# Patient Record
Sex: Male | Born: 1975 | Race: Black or African American | Hispanic: No | Marital: Single | Smoking: Former smoker
Health system: Southern US, Community
[De-identification: ages and names within clinical notes are randomized; demographics above are authoritative.]

## PROBLEM LIST (undated history)

## (undated) DIAGNOSIS — I10 Essential (primary) hypertension: Secondary | ICD-10-CM

## (undated) DIAGNOSIS — T7840XA Allergy, unspecified, initial encounter: Secondary | ICD-10-CM

## (undated) DIAGNOSIS — E785 Hyperlipidemia, unspecified: Secondary | ICD-10-CM

## (undated) DIAGNOSIS — J40 Bronchitis, not specified as acute or chronic: Secondary | ICD-10-CM

## (undated) HISTORY — DX: Bronchitis, not specified as acute or chronic: J40

## (undated) HISTORY — DX: Essential (primary) hypertension: I10

## (undated) HISTORY — DX: Allergy, unspecified, initial encounter: T78.40XA

## (undated) HISTORY — DX: Hyperlipidemia, unspecified: E78.5

---

## 2008-01-14 ENCOUNTER — Emergency Department (HOSPITAL_COMMUNITY): Admission: EM | Admit: 2008-01-14 | Discharge: 2008-01-14 | Payer: Self-pay | Admitting: Emergency Medicine

## 2009-02-09 ENCOUNTER — Emergency Department (HOSPITAL_COMMUNITY): Admission: EM | Admit: 2009-02-09 | Discharge: 2009-02-10 | Payer: Self-pay | Admitting: Emergency Medicine

## 2009-06-27 ENCOUNTER — Emergency Department (HOSPITAL_COMMUNITY): Admission: EM | Admit: 2009-06-27 | Discharge: 2009-06-27 | Payer: Self-pay | Admitting: Emergency Medicine

## 2012-09-28 ENCOUNTER — Ambulatory Visit: Payer: Self-pay | Admitting: Family Medicine

## 2012-09-28 VITALS — BP 148/92 | HR 70 | Temp 98.3°F | Resp 18 | Ht 72.0 in | Wt 310.0 lb

## 2012-09-28 DIAGNOSIS — E669 Obesity, unspecified: Secondary | ICD-10-CM

## 2012-09-28 DIAGNOSIS — K920 Hematemesis: Secondary | ICD-10-CM

## 2012-09-28 DIAGNOSIS — K921 Melena: Secondary | ICD-10-CM

## 2012-09-28 LAB — COMPREHENSIVE METABOLIC PANEL
AST: 20 U/L (ref 0–37)
Alkaline Phosphatase: 43 U/L (ref 39–117)
BUN: 12 mg/dL (ref 6–23)
Creat: 1.02 mg/dL (ref 0.50–1.35)
Total Bilirubin: 0.5 mg/dL (ref 0.3–1.2)

## 2012-09-28 LAB — POCT CBC
Lymph, poc: 1.4 (ref 0.6–3.4)
MCH, POC: 29.9 pg (ref 27–31.2)
MCHC: 32.2 g/dL (ref 31.8–35.4)
MID (cbc): 0.5 (ref 0–0.9)
MPV: 9.5 fL (ref 0–99.8)
POC MID %: 6.7 %M (ref 0–12)
Platelet Count, POC: 300 10*3/uL (ref 142–424)
RBC: 5.11 M/uL (ref 4.69–6.13)
RDW, POC: 14.3 %
WBC: 7.2 10*3/uL (ref 4.6–10.2)

## 2012-09-28 MED ORDER — BENZONATATE 100 MG PO CAPS: 100.0000 mg | ORAL_CAPSULE | Freq: Three times a day (TID) | ORAL | Status: DC | PRN

## 2012-09-28 MED ORDER — OMEPRAZOLE 20 MG PO CPDR: 40.0000 mg | DELAYED_RELEASE_CAPSULE | Freq: Every day | ORAL | Status: AC

## 2012-09-28 MED ORDER — SUCRALFATE 1 G PO TABS: 1.0000 g | ORAL_TABLET | Freq: Four times a day (QID) | ORAL | Status: AC

## 2012-09-28 NOTE — Patient Instructions (Addendum)
You need to cut down on alcohol with a goal of quitting drinking.  Please let us know if you need any help in this regard.  I will be in touch with the rest of your labs  For the time being, take a carafate tablet before meals and before bed.  Take the prilosec twice a day as well.    If you have any further bleeding please let us know.    Use the tessalon for cough.  I would try plain mucinex for your sinuses.    I will call the GI doctor on call and make sure there is nothing else we need to do for the time being.

## 2012-09-28 NOTE — Progress Notes (Addendum)
[facility] [address] [place] [address] [phone]  Date:  09/28/2012   Name:  Andre Cooper   DOB:  [birth year]   MRN:  [id number]  PCP:  No primary provider on file.    Chief Complaint: vomited blood, feels hot and Dizziness   History of Present Illness:  Andre Cooper is a 37 y.o. very pleasant male patient who presents with the following:  He "threw up blood" this morning, so he became concerned and came in for evaluation.  Vomited with blood twice.  It was dark red blood, seemed "like a lot."    He has a history of ulcer.  He had this ulcer 5 or 6 years ago, [place].  He is not quite sure what sort of treatment he had.  He stopped drinking "for a while" and felt better, the ulcer healed.  However he is now drinking again, as of the last 8 months or so.   He is drinking a fifth of liquor most days.   He also felt dizzy and hot this morning. No syncope  He has not seen any blood in his stool, no black or tarry stools.  He states he had a physical of some sort about one month ago, and was told to take lab results to a doctor for evaluation. He does not have his labs with him today and is not sure what was abnormal  He also has noted mucus in his throat for about 2 days, mild cough, and wants "some good sinus pills" for sinus congestion and something for cough.    There are no active problems to display for this patient.   Past Medical History  Diagnosis Date  . Allergy   . Bronchitis     History reviewed. No pertinent past surgical history.  History  Substance Use Topics  . Smoking status: Former Games developer  . Smokeless tobacco: Not on file  . Alcohol Use: Yes    Family History  Problem Relation Age of Onset  . Cancer Maternal Grandmother   . Tuberculosis Maternal Grandfather   . Alzheimer's disease Paternal Grandmother     No Known Allergies  Medication list has been reviewed and updated.  No current outpatient  prescriptions on file prior to visit.   No current facility-administered medications on file prior to visit.    Review of Systems:  As per HPI- otherwise negative  Physical Examination: Filed Vitals:   09/28/12 1247  BP: 148/92  Pulse: 70  Temp: 98.3 F (36.8 C)  Resp: 18   Filed Vitals:   09/28/12 1247  Height: 6' (1.829 m)  Weight: 310 lb (140.615 kg)   Body mass index is 42.03 kg/(m^2). Ideal Body Weight: Weight in (lb) to have BMI = 25: 183.9  GEN: WDWN, NAD, Non-toxic, A & O x 3, obese.  Tattoos over most of his body surface HEENT: Atraumatic, Normocephalic. Neck supple. No masses, No LAD.  Bilateral TM wnl, oropharynx normal.  PEERL,EOMI.   Ears and Nose: No external deformity. CV: RRR, No M/G/R. No JVD. No thrill. No extra heart sounds. PULM: CTA B, no wheezes, crackles, rhonchi. No retractions. No resp. distress. No accessory muscle use. ABD: S, NT, ND, +BS. No rebound. No definite hepatomegaly but obese abdomen limits exam.  Small umbilical hernia EXTR: No c/c/e NEURO Normal gait.  PSYCH: Normally interactive. Conversant. Not depressed or anxious appearing.  Calm demeanor.  Rectal: no gross blood  Results for orders  placed in visit on 09/28/12  POCT CBC      Result Value Range   WBC 7.2  4.6 - 10.2 K/uL   Lymph, poc 1.4  0.6 - 3.4   POC LYMPH PERCENT 19.3  10 - 50 %L   MID (cbc) 0.5  0 - 0.9   POC MID % 6.7  0 - 12 %M   POC Granulocyte 5.3  2 - 6.9   Granulocyte percent 74.0  37 - 80 %G   RBC 5.11  4.69 - 6.13 M/uL   Hemoglobin 15.3  14.1 - 18.1 g/dL   HCT, POC 04.5  40.9 - 53.7 %   MCV 93.0  80 - 97 fL   MCH, POC 29.9  27 - 31.2 pg   MCHC 32.2  31.8 - 35.4 g/dL   RDW, POC 81.1     Platelet Count, POC 300  142 - 424 K/uL   MPV 9.5  0 - 99.8 fL  IFOBT (OCCULT BLOOD)      Result Value Range   IFOBT Negative      Assessment and Plan: Hematemesis - Plan: POCT CBC, IFOBT POC (occult bld, rslt in office), Comprehensive metabolic panel, sucralfate  (CARAFATE) 1 G tablet, benzonatate (TESSALON) 100 MG capsule, omeprazole (PRILOSEC) 20 MG capsule, Protime-INR  Terrence is here today with mild upper GI bleed, question recurrent ulcer vs mallory- weiss tear.  For the time being will have him take caraftate and prilosec.  Encouraged him to d/c alcohol; he may need to taper over the course of a week or so if he has withdrawyl sx, vs doing a detox program.  He will let us kow if he needs any help with this process.  Will refer him to GI to have an upper endoscopy.    Signed Abbe Amsterdam, MD  Results for orders placed in visit on 09/28/12  COMPREHENSIVE METABOLIC PANEL      Result Value Range   Sodium 139  135 - 145 mEq/L   Potassium 4.6  3.5 - 5.3 mEq/L   Chloride 105  96 - 112 mEq/L   CO2 28  19 - 32 mEq/L   Glucose, Bld 88  70 - 99 mg/dL   BUN 12  6 - 23 mg/dL   Creat 9.14  7.82 - 9.56 mg/dL   Total Bilirubin 0.5  0.3 - 1.2 mg/dL   Alkaline Phosphatase 43  39 - 117 U/L   AST 20  0 - 37 U/L   ALT 25  0 - 53 U/L   Total Protein 7.2  6.0 - 8.3 g/dL   Albumin 4.4  3.5 - 5.2 g/dL   Calcium 9.0  8.4 - 21.3 mg/dL  PROTIME-INR      Result Value Range   Prothrombin Time 13.1  11.6 - 15.2 seconds   INR 0.99  <1.50  POCT CBC      Result Value Range   WBC 7.2  4.6 - 10.2 K/uL   Lymph, poc 1.4  0.6 - 3.4   POC LYMPH PERCENT 19.3  10 - 50 %L   MID (cbc) 0.5  0 - 0.9   POC MID % 6.7  0 - 12 %M   POC Granulocyte 5.3  2 - 6.9   Granulocyte percent 74.0  37 - 80 %G   RBC 5.11  4.69 - 6.13 M/uL   Hemoglobin 15.3  14.1 - 18.1 g/dL   HCT, POC 08.6  57.8 - 53.7 %   MCV  93.0  80 - 97 fL   MCH, POC 29.9  27 - 31.2 pg   MCHC 32.2  31.8 - 35.4 g/dL   RDW, POC 16.1     Platelet Count, POC 300  142 - 424 K/uL   MPV 9.5  0 - 99.8 fL  IFOBT (OCCULT BLOOD)      Result Value Range   IFOBT Negative     INR, LFTs ok.  Will make referral to GI

## 2012-09-29 NOTE — Addendum Note (Signed)
Addended by: Abbe Amsterdam C on: 09/29/2012 10:24 AM   Modules accepted: Orders

## 2012-11-19 ENCOUNTER — Encounter: Payer: Self-pay | Admitting: Family Medicine

## 2013-01-06 ENCOUNTER — Ambulatory Visit: Payer: Self-pay | Admitting: Family Medicine

## 2013-01-06 VITALS — BP 152/85 | HR 84 | Temp 97.8°F | Resp 18 | Wt 311.0 lb

## 2013-01-06 DIAGNOSIS — J45901 Unspecified asthma with (acute) exacerbation: Secondary | ICD-10-CM

## 2013-01-06 MED ORDER — ALBUTEROL SULFATE HFA 108 (90 BASE) MCG/ACT IN AERS: 2.0000 | INHALATION_SPRAY | Freq: Four times a day (QID) | RESPIRATORY_TRACT | Status: AC | PRN

## 2013-01-06 MED ORDER — ALBUTEROL SULFATE (2.5 MG/3ML) 0.083% IN NEBU: 2.5000 mg | INHALATION_SOLUTION | Freq: Once | RESPIRATORY_TRACT | Status: AC

## 2013-01-06 MED ORDER — BENZONATATE 100 MG PO CAPS: 100.0000 mg | ORAL_CAPSULE | Freq: Three times a day (TID) | ORAL | Status: AC | PRN

## 2013-01-06 NOTE — Progress Notes (Signed)
[facility] [address] [place] [address] [phone]  Date:  01/06/2013   Name:  Andre Cooper   DOB:  1975-07-18   MRN:  [id number]  PCP:  No primary provider on file.    Chief Complaint: Shortness of Breath, Cough and Asthma   History of Present Illness:  Andre Cooper is a 37 y.o. very pleasant male patient who presents with the following:  He states that a few years ago he was told that he had "bronchitis and asthma."  He notes that certain fumes will make him worse- like when he was exposed to kerosene heat.  A few years ago he was given an inhaler to use and told to stop smoking.  He notes that his sx are worse during the winter. He has been wheezing for 2 or 3 years.  He has had a cough for the last week or so.   His MIL has given him some of her cough syrup which did seem to help.  Apparently this was tussionex and he would like to have some of this He has not noted a fever.   He is not coughing anything up.    He used to have his worst sx after smoking MJ cigars.  He now "only" smokes twice a day, sometimes 3 times.   He is also trying to stop drinking.  He is trying to cut down from a 1/5 liquor a day.  Uncertain how much he is actually drinking now  Patient Active Problem List   Diagnosis Date Noted  . Obesity, unspecified 09/28/2012    Past Medical History  Diagnosis Date  . Allergy   . Bronchitis     No past surgical history on file.  History  Substance Use Topics  . Smoking status: Former Games developer  . Smokeless tobacco: Not on file  . Alcohol Use: Yes    Family History  Problem Relation Age of Onset  . Cancer Maternal Grandmother   . Tuberculosis Maternal Grandfather   . Alzheimer's disease Paternal Grandmother     No Known Allergies  Medication list has been reviewed and updated.  Current Outpatient Prescriptions on File Prior to Visit  Medication Sig Dispense Refill  . benzonatate (TESSALON) 100 MG capsule Take  1 capsule (100 mg total) by mouth 3 (three) times daily as needed for cough.  40 capsule  0  . omeprazole (PRILOSEC) 20 MG capsule Take 2 capsules (40 mg total) by mouth daily.  50 capsule  2  . sucralfate (CARAFATE) 1 G tablet Take 1 tablet (1 g total) by mouth 4 (four) times daily.  40 tablet  0   No current facility-administered medications on file prior to visit.    Review of Systems:  As per HPI- otherwise negative.Marland Kitchen  Physical Examination: Filed Vitals:   01/06/13 1437  BP: 152/85  Pulse: 84  Temp: 97.8 F (36.6 C)  Resp: 18   Filed Vitals:   01/06/13 1437  Weight: 311 lb (141.069 kg)   Body mass index is 42.17 kg/(m^2). Ideal Body Weight:    GEN: WDWN, NAD, Non-toxic, A & O x 3, obese.  Tattoos over most of his body including his face HEENT: Atraumatic, Normocephalic. Neck supple. No masses, No LAD. Ears and Nose: No external deformity. CV: RRR, No M/G/R. No JVD. No thrill. No extra heart sounds. PULM: CTA B, no wheezes, crackles, rhonchi. No retractions. No resp. distress. No accessory muscle use. EXTR: No c/c/e NEURO Normal  gait.  PSYCH: Normally interactive. Conversant. Not depressed or anxious appearing.  Calm demeanor.   Albuterol neb; he felt better.  States he has no current SOB Assessment and Plan: Asthma with acute exacerbation - Plan: albuterol (PROVENTIL) (2.5 MG/3ML) 0.083% nebulizer solution 2.5 mg, albuterol (PROVENTIL HFA;VENTOLIN HFA) 108 (90 BASE) MCG/ACT inhaler, benzonatate (TESSALON) 100 MG capsule  History of asthma with increased sx for several weeks.  Explained that smoking any substance will make his asthma worse.   Also explained that I am not willing to rx tussionex while he is actively using MJ and abusing alcohol.  This combination could be dangerous to him.    Albuterol HFA to use prn.  Asked him to let me know if he does not feel better soon   Signed Abbe Amsterdam, MD

## 2013-01-06 NOTE — Patient Instructions (Signed)
Let us know if you do not feel better soon. Use the albuterol as needed- you can use 1 or 2 puffs every 6 hours as needed.

## 2016-04-20 ENCOUNTER — Encounter: Payer: Self-pay | Admitting: Podiatry

## 2016-04-20 ENCOUNTER — Ambulatory Visit (INDEPENDENT_AMBULATORY_CARE_PROVIDER_SITE_OTHER): Payer: Medicaid Other

## 2016-04-20 ENCOUNTER — Telehealth: Payer: Self-pay | Admitting: *Deleted

## 2016-04-20 ENCOUNTER — Ambulatory Visit (INDEPENDENT_AMBULATORY_CARE_PROVIDER_SITE_OTHER): Payer: Medicaid Other | Admitting: Podiatry

## 2016-04-20 DIAGNOSIS — M21619 Bunion of unspecified foot: Secondary | ICD-10-CM

## 2016-04-20 DIAGNOSIS — R52 Pain, unspecified: Secondary | ICD-10-CM

## 2016-04-20 DIAGNOSIS — B351 Tinea unguium: Secondary | ICD-10-CM | POA: Diagnosis not present

## 2016-04-20 DIAGNOSIS — M79671 Pain in right foot: Secondary | ICD-10-CM

## 2016-04-20 DIAGNOSIS — M216X9 Other acquired deformities of unspecified foot: Secondary | ICD-10-CM

## 2016-04-20 DIAGNOSIS — M79672 Pain in left foot: Secondary | ICD-10-CM | POA: Diagnosis not present

## 2016-04-20 MED ORDER — NONFORMULARY OR COMPOUNDED ITEM: 2 refills | Status: AC

## 2016-04-20 MED ORDER — MELOXICAM 15 MG PO TABS: 15.0000 mg | ORAL_TABLET | Freq: Every day | ORAL | 2 refills | Status: AC

## 2016-04-20 NOTE — Progress Notes (Signed)
   Subjective:    Patient ID: Andre Cooper, male    DOB: 1975/08/20, 41 y.o.   MRN: [id number]  HPI  41 year old male presents the office today for concerns of bilateral foot and the right side worse than left which has been ongoing for 03/28/2010 months. He states that he has pain when he walks as the day goes on the gets more pain. He typically wears a shower shoe around. He denies any recent injury or trauma. His medications swelling to his feet but denies any redness or warmth. He denies any numbness or tingling he denies any recent injury or trauma or treatment. He has a states that his right big toenails thick and discolored but denies any pain to the area. He has no other complaints at this time.   Review of Systems  All other systems reviewed and are negative.      Objective:   Physical Exam  General: AAO x3, NAD  Dermatological: Right hallux toe nails hypertrophic, dystrophic, brittle, discolored, elongated. There is no pain with internal redness from the redness or drainage. There is no conical signs of infection. There is no open lesions or pre-ulcer lesions identified at this time.  Vascular: Dorsalis Pedis artery and Posterior Tibial artery pedal pulses are 2/4 bilateral with immedate capillary fill time. Pedal hair growth present. There is no pain with calf compression, swelling, warmth, erythema.   Neruologic: Grossly intact via light touch bilateral. Vibratory intact via tuning fork bilateral. Protective threshold with Semmes Wienstein monofilament intact to all pedal sites bilateral.   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 I am this I'm unable to elicit any area of tenderness to bilateral lower extremities. He points the arch of the foot where he gets pain subjectively however he is not having pain today. There is no area pinpoint bony tenderness or pain the vibratory sensation. There is no significant  edema there is no erythema or increase in warmth. MMT 5/5. Range motion intact. Equinus is present however.  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 Mild HAV is present. Is no evidence of acute fracture identified. -I with a lot of his pain is due to muscle tightness I discussed with exercises. I also discussed change in shoe gear and to wear more supportive shoe not to wear flat flip-flop all the time. -Prescribed mobic. Discussed side effects of the medication and directed to stop if any are to occur and call the office.  -Compound cream ordered today through [employer].  -Right hallux toenail is debrided this was sent for culture. -RTC 4 weeks or sooner.. Call any questions or concerns meantime.  Ovid Curd, DPM

## 2016-04-20 NOTE — Patient Instructions (Signed)

## 2016-04-20 NOTE — Telephone Encounter (Signed)
Dr. Ardelle Anton ordered [facility] Antiinflammatory Cream. Faxed to [facility].

## 2016-04-21 ENCOUNTER — Emergency Department (HOSPITAL_COMMUNITY): Payer: Medicaid Other

## 2016-04-21 ENCOUNTER — Encounter (HOSPITAL_COMMUNITY): Payer: Self-pay | Admitting: Emergency Medicine

## 2016-04-21 ENCOUNTER — Emergency Department (HOSPITAL_COMMUNITY)
Admission: EM | Admit: 2016-04-21 | Discharge: 2016-04-21 | Disposition: A | Discharge status: Home / Self Care | Payer: Medicaid Other | Attending: Emergency Medicine | Admitting: Emergency Medicine

## 2016-04-21 DIAGNOSIS — I10 Essential (primary) hypertension: Secondary | ICD-10-CM | POA: Diagnosis not present

## 2016-04-21 DIAGNOSIS — W07XXXA Fall from chair, initial encounter: Secondary | ICD-10-CM | POA: Insufficient documentation

## 2016-04-21 DIAGNOSIS — Z79899 Other long term (current) drug therapy: Secondary | ICD-10-CM | POA: Insufficient documentation

## 2016-04-21 DIAGNOSIS — Y939 Activity, unspecified: Secondary | ICD-10-CM | POA: Insufficient documentation

## 2016-04-21 DIAGNOSIS — Y999 Unspecified external cause status: Secondary | ICD-10-CM | POA: Insufficient documentation

## 2016-04-21 DIAGNOSIS — S161XXA Strain of muscle, fascia and tendon at neck level, initial encounter: Secondary | ICD-10-CM | POA: Insufficient documentation

## 2016-04-21 DIAGNOSIS — Y92511 Restaurant or cafe as the place of occurrence of the external cause: Secondary | ICD-10-CM | POA: Diagnosis not present

## 2016-04-21 DIAGNOSIS — S0990XA Unspecified injury of head, initial encounter: Secondary | ICD-10-CM | POA: Diagnosis present

## 2016-04-21 DIAGNOSIS — Z87891 Personal history of nicotine dependence: Secondary | ICD-10-CM | POA: Diagnosis not present

## 2016-04-21 DIAGNOSIS — W19XXXA Unspecified fall, initial encounter: Secondary | ICD-10-CM

## 2016-04-21 DIAGNOSIS — T148XXA Other injury of unspecified body region, initial encounter: Secondary | ICD-10-CM

## 2016-04-21 MED ORDER — HYDROCODONE-ACETAMINOPHEN 5-325 MG PO TABS
2.0000 | ORAL_TABLET | Freq: Once | ORAL | Status: AC
  Administered 2016-04-21: 2 via ORAL
  Filled 2016-04-21: qty 2

## 2016-04-21 MED ORDER — CYCLOBENZAPRINE HCL 10 MG PO TABS: 10.0000 mg | ORAL_TABLET | Freq: Three times a day (TID) | ORAL | 0 refills | Status: AC | PRN

## 2016-04-21 MED ORDER — NAPROXEN 500 MG PO TABS: 500.0000 mg | ORAL_TABLET | Freq: Two times a day (BID) | ORAL | 0 refills | Status: AC | PRN

## 2016-04-21 NOTE — ED Notes (Signed)
Pt understood dc material. NAD noted. Scripts given at dc 

## 2016-04-21 NOTE — ED Triage Notes (Signed)
Pt reports while at a restaurant, he went to sit in a chair and the chair broke. Pt reports that he landed on left side and hit head on metal shelf behind. Pt denies + LOC reports some dizziness after incident. Pt c/o pain to left leg/knee, left wrist and back of head.

## 2016-04-21 NOTE — ED Provider Notes (Signed)
[facility]-EMERGENCY DEPT Provider Note   CSN: [id number] Arrival date & time: 04/21/16  4098     History   Chief Complaint Chief Complaint  Patient presents with  . Fall  . Leg Injury  . Knee Pain  . Wrist Pain  . Head Injury    HPI Andre Cooper is a 41 y.o. male.  HPI 41 year old male with no significant past medical history who presents with headache, neck pain, left arm pain, and left leg pain. The patient states he was in his usual state of health until this afternoon. He sat down in a chair when it reportedly broke and its legs buckled inward. He reportedly fell onto his left side then struck the back of his head. No loss of consciousness but he was dizzy. He describes an aching, throbbing lower back pain, headache, and left hip and arm pain. He was able to ambulate after the episode. He is not on blood thinners. No other medical complaints.  Past Medical History:  Diagnosis Date  . Allergy   . Bronchitis   . Hyperlipidemia   . Hypertension     Patient Active Problem List   Diagnosis Date Noted  . Obesity, unspecified 09/28/2012    History reviewed. No pertinent surgical history.     Home Medications    Prior to Admission medications   Medication Sig Start Date End Date Taking? Authorizing Provider  albuterol (PROVENTIL HFA;VENTOLIN HFA) 108 (90 BASE) MCG/ACT inhaler Inhale 2 puffs into the lungs every 6 (six) hours as needed for wheezing or shortness of breath. Patient not taking: Reported on 04/20/2016 01/06/13   Pearline Cables, MD  atorvastatin (LIPITOR) 10 MG tablet Take 10 mg by mouth at bedtime. 04/12/16   Historical Provider, MD  benzonatate (TESSALON) 100 MG capsule Take 1 capsule (100 mg total) by mouth 3 (three) times daily as needed for cough. Patient not taking: Reported on 04/20/2016 01/06/13   Pearline Cables, MD  chlorthalidone (HYGROTON) 25 MG tablet TAKE 1 TABLET (25 MG) BY ORAL ROUTE ONCE DAILY FOR 30 DAYS 04/12/16   Historical Provider, MD    cyclobenzaprine (FLEXERIL) 10 MG tablet Take 1 tablet (10 mg total) by mouth 3 (three) times daily as needed for muscle spasms. 04/21/16   Shaune Pollack, MD  meloxicam (MOBIC) 15 MG tablet Take 1 tablet (15 mg total) by mouth daily. 04/20/16 04/20/17  Vivi Barrack, DPM  naproxen (NAPROSYN) 500 MG tablet Take 1 tablet (500 mg total) by mouth 2 (two) times daily as needed for moderate pain. 04/21/16 04/28/16  Shaune Pollack, MD  NONFORMULARY OR COMPOUNDED ITEM [facility]: Antiinflammatory Cream - Diclofenac 3%, Baclofen 2%, Lidocaine 2%, apply 1-2 grams to affected area 3-4 times daily. 04/20/16   Vivi Barrack, DPM  omeprazole (PRILOSEC) 20 MG capsule Take 2 capsules (40 mg total) by mouth daily. Patient not taking: Reported on 04/20/2016 09/28/12   Gwenlyn Found Copland, MD  sucralfate (CARAFATE) 1 G tablet Take 1 tablet (1 g total) by mouth 4 (four) times daily. Patient not taking: Reported on 04/20/2016 09/28/12   Pearline Cables, MD    Family History Family History  Problem Relation Age of Onset  . Cancer Maternal Grandmother   . Tuberculosis Maternal Grandfather   . Alzheimer's disease Paternal Grandmother     Social History Social History  Substance Use Topics  . Smoking status: Former Games developer  . Smokeless tobacco: Never Used  . Alcohol use Yes     Allergies  Patient has no known allergies.   Review of Systems Review of Systems  Constitutional: Negative for chills, fatigue and fever.  HENT: Negative for congestion and rhinorrhea.   Eyes: Negative for visual disturbance.  Respiratory: Negative for cough, shortness of breath and wheezing.   Cardiovascular: Negative for chest pain and leg swelling.  Gastrointestinal: Negative for abdominal pain, diarrhea, nausea and vomiting.  Genitourinary: Negative for dysuria and flank pain.  Musculoskeletal: Positive for arthralgias, back pain, neck pain and neck stiffness.  Skin: Negative for rash and wound.  Allergic/Immunologic:  Negative for immunocompromised state.  Neurological: Positive for dizziness and headaches. Negative for syncope and weakness.  All other systems reviewed and are negative.    Physical Exam Updated Vital Signs BP (!) 118/99   Pulse 70   Temp 98.7 F (37.1 C) (Oral)   Resp 16   Ht 6' (1.829 m)   Wt (!) 310 lb (140.6 kg)   SpO2 99%   BMI 42.04 kg/m   Physical Exam  Constitutional: He is oriented to person, place, and time. He appears well-developed and well-nourished. No distress.  HENT:  Head: Normocephalic and atraumatic.  No apparent trauma to the head, neck, or face.  Eyes: Conjunctivae are normal.  Neck: Neck supple.  Mild bilateral paraspinal tenderness. No midline deformity. Full, painless range of motion.  Cardiovascular: Normal rate, regular rhythm and normal heart sounds.  Exam reveals no friction rub.   No murmur heard. Pulmonary/Chest: Effort normal and breath sounds normal. No respiratory distress. He has no wheezes. He has no rales.  Abdominal: Soft. He exhibits no distension.  Musculoskeletal: He exhibits tenderness (Moderate left paraspinal tenderness.). He exhibits no edema.  Tenderness to palpation over left elbow as well as left greater trochanter and knee. No bruising or deformity.  Neurological: He is alert and oriented to person, place, and time. No cranial nerve deficit. He exhibits normal muscle tone.  Distal strength and sensation 5 out of 5 and intact in bilateral upper and lower extremities.  Skin: Skin is warm. Capillary refill takes less than 2 seconds.  Psychiatric: He has a normal mood and affect.  Nursing note and vitals reviewed.    ED Treatments / Results  Labs (all labs ordered are listed, but only abnormal results are displayed) Labs Reviewed - No data to display  EKG  EKG Interpretation None       Radiology Dg Chest 2 View  Result Date: 04/21/2016 CLINICAL DATA:  Status post fall when chair broke, with left-sided chest pain.  Initial encounter. EXAM: CHEST  2 VIEW COMPARISON:  Chest radiograph performed 01/14/2008 FINDINGS: The lungs are well-aerated and clear. There is no evidence of focal opacification, pleural effusion or pneumothorax. The heart is normal in size; the mediastinal contour is within normal limits. No acute osseous abnormalities are seen. IMPRESSION: No acute cardiopulmonary process seen. No displaced rib fractures identified. Electronically Signed   By: Roanna Raider M.D.   On: 04/21/2016 20:46   Dg Lumbar Spine 2-3 Views  Result Date: 04/21/2016 CLINICAL DATA:  Status post fall when chair broke, with lower back pain. Initial encounter. EXAM: LUMBAR SPINE - 2-3 VIEW COMPARISON:  None. FINDINGS: There is no evidence of fracture or subluxation. Vertebral bodies demonstrate normal height and alignment. Intervertebral disc spaces are preserved. The visualized neural foramina are grossly unremarkable in appearance. The visualized bowel gas pattern is unremarkable in appearance; air and stool are noted within the colon. The sacroiliac joints are within normal limits. IMPRESSION: No  evidence of fracture or subluxation along the lumbar spine. Electronically Signed   By: Roanna Raider M.D.   On: 04/21/2016 20:53   Dg Elbow Complete Left  Result Date: 04/21/2016 CLINICAL DATA:  Status post fall onto left side, with left elbow pain. Initial encounter. EXAM: LEFT ELBOW - COMPLETE 3+ VIEW COMPARISON:  None. FINDINGS: There is no evidence of fracture or dislocation. The visualized joint spaces are preserved. No significant joint effusion is identified. The soft tissues are unremarkable in appearance. IMPRESSION: No evidence of fracture or dislocation. Electronically Signed   By: Roanna Raider M.D.   On: 04/21/2016 20:53   Dg Knee 2 Views Left  Result Date: 04/21/2016 CLINICAL DATA:  Status post fall when chair broke, with left knee pain. Initial encounter. EXAM: LEFT KNEE - 1-2 VIEW COMPARISON:  None. FINDINGS: There  is no evidence of fracture or dislocation. The joint spaces are preserved. No significant degenerative change is seen; the patellofemoral joint is grossly unremarkable in appearance. No significant joint effusion is seen. The visualized soft tissues are normal in appearance. IMPRESSION: No evidence of fracture or dislocation. Electronically Signed   By: Roanna Raider M.D.   On: 04/21/2016 20:46   Ct Head Wo Contrast  Result Date: 04/21/2016 CLINICAL DATA:  Was in a chair in a restaurant when the chair gave way with patient falling backwards, struck head on concrete floor and against a metal chest drawer, pain at back of head, neck pain, dizziness, history hypertension, former smoker EXAM: CT HEAD WITHOUT CONTRAST CT CERVICAL SPINE WITHOUT CONTRAST TECHNIQUE: Multidetector CT imaging of the head and cervical spine was performed following the standard protocol without intravenous contrast. Multiplanar CT image reconstructions of the cervical spine were also generated. COMPARISON:  None FINDINGS: CT HEAD FINDINGS Brain: Normal ventricular morphology. No midline shift or mass effect. Normal appearance of brain parenchyma. No intracranial hemorrhage, mass lesion, evidence of acute infarction, or extra-axial fluid collection. Vascular: Unremarkable Skull: Intact Sinuses/Orbits: Clear Other: N/A CT CERVICAL SPINE FINDINGS Alignment: Normal Skull base and vertebrae: Degradation of image quality at the mid to caudal cervical spine secondary to body habitus. Visualized skullbase intact. Osseous mineralization grossly normal. Vertebral body heights maintained without fracture or bone destruction. Minimal motion artifacts at T1. Soft tissues and spinal canal: Prevertebral soft tissues normal thickness. Visualize cervical soft tissues otherwise unremarkable. Disc levels:  Maintained Upper chest: Lung apices clear. Other: N/A IMPRESSION: No acute intracranial abnormalities. No acute cervical spine abnormalities as above.  Electronically Signed   By: Ulyses Southward M.D.   On: 04/21/2016 20:42   Ct Cervical Spine Wo Contrast  Result Date: 04/21/2016 CLINICAL DATA:  Was in a chair in a restaurant when the chair gave way with patient falling backwards, struck head on concrete floor and against a metal chest drawer, pain at back of head, neck pain, dizziness, history hypertension, former smoker EXAM: CT HEAD WITHOUT CONTRAST CT CERVICAL SPINE WITHOUT CONTRAST TECHNIQUE: Multidetector CT imaging of the head and cervical spine was performed following the standard protocol without intravenous contrast. Multiplanar CT image reconstructions of the cervical spine were also generated. COMPARISON:  None FINDINGS: CT HEAD FINDINGS Brain: Normal ventricular morphology. No midline shift or mass effect. Normal appearance of brain parenchyma. No intracranial hemorrhage, mass lesion, evidence of acute infarction, or extra-axial fluid collection. Vascular: Unremarkable Skull: Intact Sinuses/Orbits: Clear Other: N/A CT CERVICAL SPINE FINDINGS Alignment: Normal Skull base and vertebrae: Degradation of image quality at the mid to caudal cervical spine secondary to  body habitus. Visualized skullbase intact. Osseous mineralization grossly normal. Vertebral body heights maintained without fracture or bone destruction. Minimal motion artifacts at T1. Soft tissues and spinal canal: Prevertebral soft tissues normal thickness. Visualize cervical soft tissues otherwise unremarkable. Disc levels:  Maintained Upper chest: Lung apices clear. Other: N/A IMPRESSION: No acute intracranial abnormalities. No acute cervical spine abnormalities as above. Electronically Signed   By: Ulyses Southward M.D.   On: 04/21/2016 20:42   Dg Hip Unilat W Or Wo Pelvis 2-3 Views Left  Result Date: 04/21/2016 CLINICAL DATA:  Status post fall when chair broke, with left hip pain. Initial encounter. EXAM: DG HIP (WITH OR WITHOUT PELVIS) 2-3V LEFT COMPARISON:  None. FINDINGS: There is no  evidence of fracture or dislocation. Both femoral heads are seated normally within their respective acetabula. The proximal left femur appears intact. No significant degenerative change is appreciated. The sacroiliac joints are unremarkable in appearance. The visualized bowel gas pattern is grossly unremarkable in appearance. Scattered phleboliths are noted within the pelvis. IMPRESSION: No evidence of fracture or dislocation. Electronically Signed   By: Roanna Raider M.D.   On: 04/21/2016 20:46    Procedures Procedures (including critical care time)  Medications Ordered in ED Medications  HYDROcodone-acetaminophen (NORCO/VICODIN) 5-325 MG per tablet 2 tablet (2 tablets Oral Given 04/21/16 1952)     Initial Impression / Assessment and Plan / ED Course  I have reviewed the triage vital signs and the nursing notes.  Pertinent labs & imaging results that were available during my care of the patient were reviewed by me and considered in my medical decision making (see chart for details).     41 year old male with no significant past medical history here with diffuse pain after mechanical fall from ground level. Vital signs are stable. Imaging is negative for acute fracture or abnormality. Suspect musculoskeletal pain and contusions. No blood thinner use or high risk features. Will discharge with NSAIDs and muscle relaxants.  Final Clinical Impressions(s) / ED Diagnoses   Final diagnoses:  Fall, initial encounter  Muscle strain    New Prescriptions Discharge Medication List as of 04/21/2016  9:10 PM    START taking these medications   Details  cyclobenzaprine (FLEXERIL) 10 MG tablet Take 1 tablet (10 mg total) by mouth 3 (three) times daily as needed for muscle spasms., Starting Sat 04/21/2016, Print    naproxen (NAPROSYN) 500 MG tablet Take 1 tablet (500 mg total) by mouth 2 (two) times daily as needed for moderate pain., Starting Sat 04/21/2016, Until Sat 04/28/2016, Print           Shaune Pollack, MD 04/22/16 [phone]

## 2016-04-24 ENCOUNTER — Other Ambulatory Visit (HOSPITAL_BASED_OUTPATIENT_CLINIC_OR_DEPARTMENT_OTHER): Payer: Self-pay

## 2016-04-24 DIAGNOSIS — R0683 Snoring: Secondary | ICD-10-CM

## 2016-04-24 DIAGNOSIS — G4700 Insomnia, unspecified: Secondary | ICD-10-CM

## 2016-04-25 LAB — FUNGAL STAIN

## 2016-05-01 ENCOUNTER — Ambulatory Visit: Payer: Self-pay | Admitting: Surgery

## 2016-05-18 ENCOUNTER — Ambulatory Visit: Payer: Medicaid Other | Admitting: Podiatry

## 2016-06-12 ENCOUNTER — Ambulatory Visit (HOSPITAL_BASED_OUTPATIENT_CLINIC_OR_DEPARTMENT_OTHER): Payer: Medicaid Other | Attending: Nurse Practitioner

## 2016-06-26 ENCOUNTER — Encounter: Payer: Self-pay | Admitting: Internal Medicine

## 2018-02-20 IMAGING — DX DG ELBOW COMPLETE 3+V*L*
4 series · 4 of 4 positions shown · non-contrast
Comparison: None.

CLINICAL DATA: Status post fall onto left side, with left elbow
pain. Initial encounter.

EXAM:
LEFT ELBOW - COMPLETE 3+ VIEW

[elbow ap]
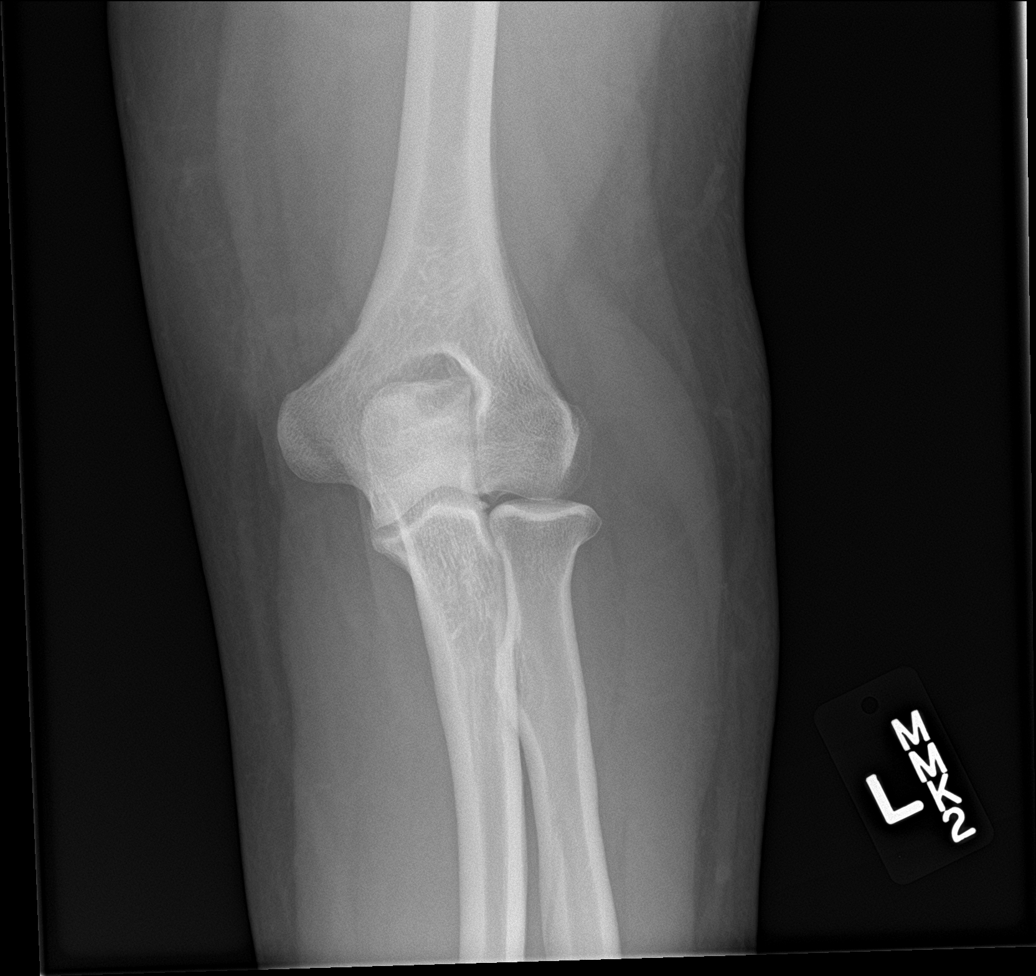

[elbow obl (1 of 2)]
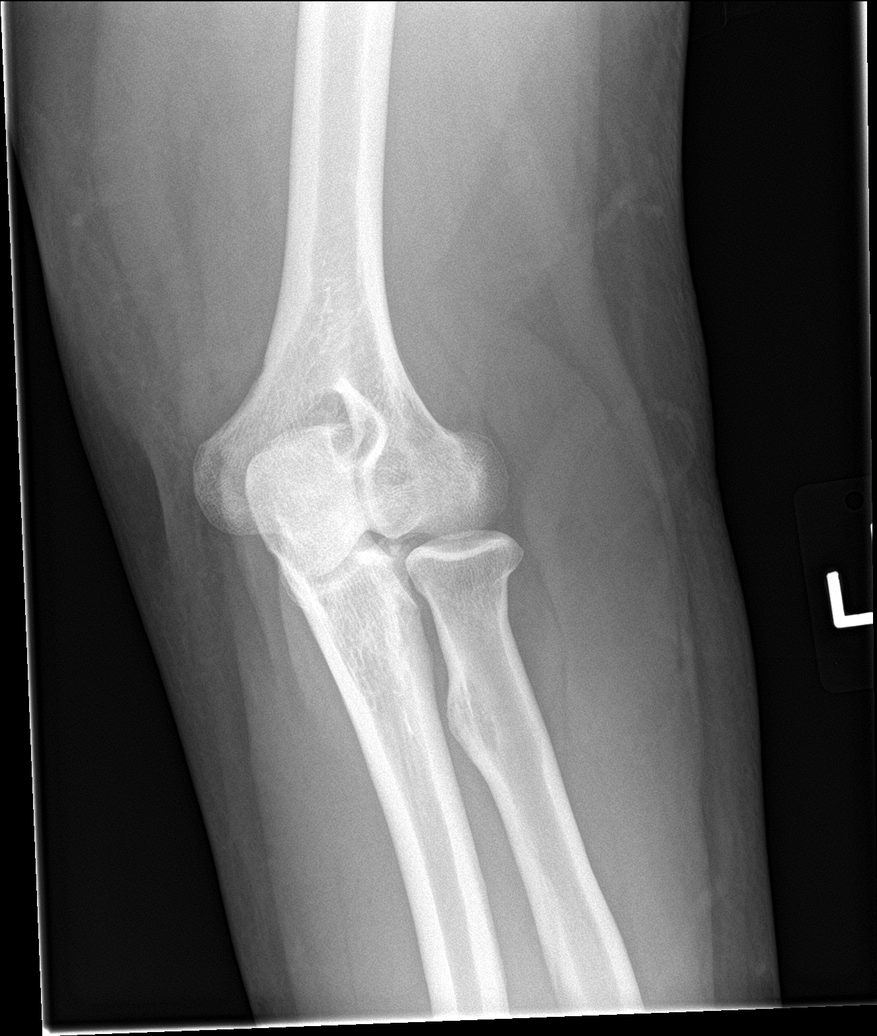

[elbow obl (2 of 2)]
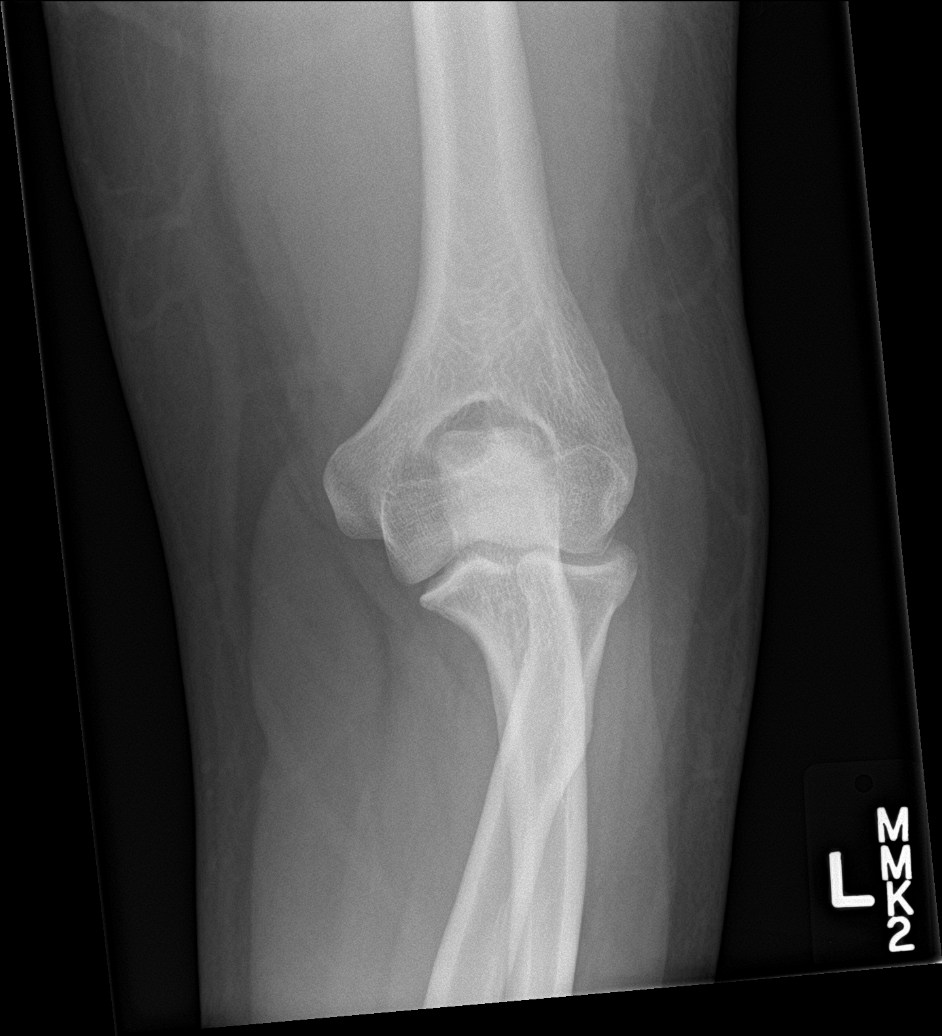

[elbow lat]
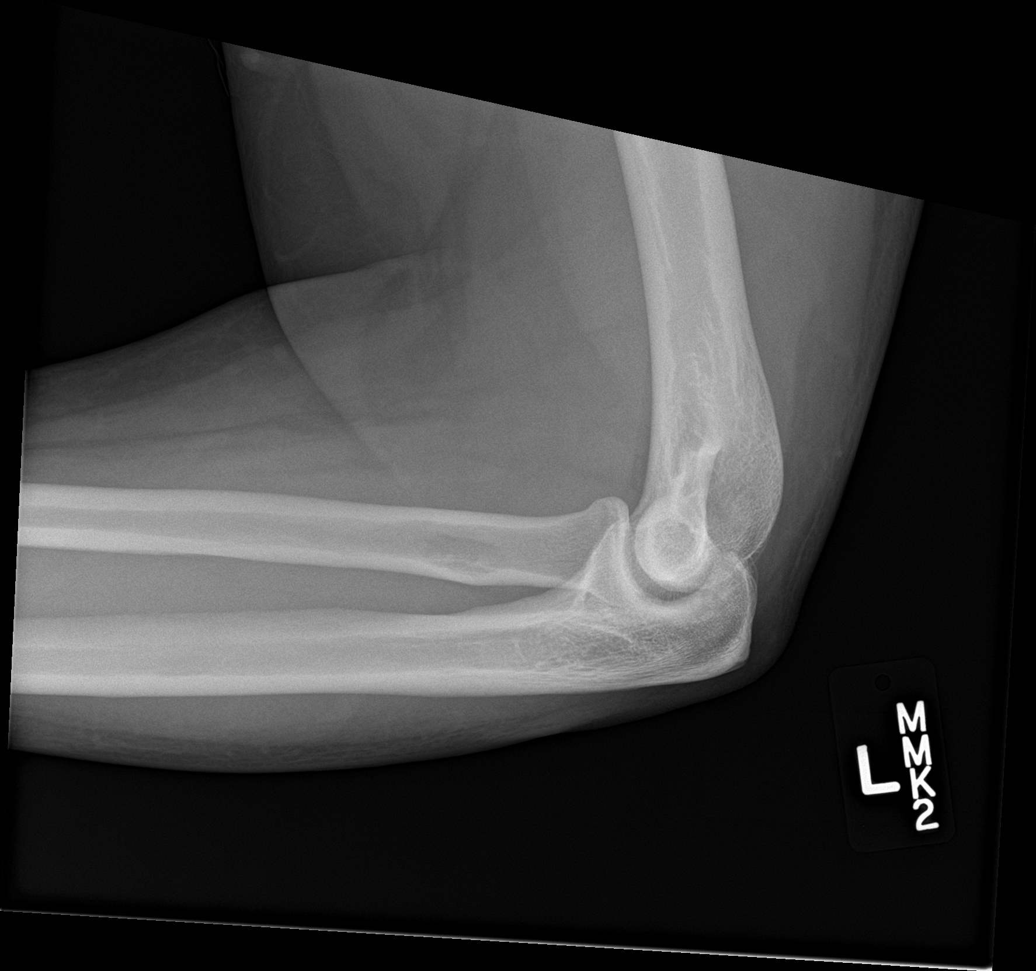

[4 of 4 positions shown; findings below may reference images not displayed]

FINDINGS: There is no evidence of fracture or dislocation. The visualized
joint spaces are preserved. No significant joint effusion is
identified. The soft tissues are unremarkable in appearance.
IMPRESSION: No evidence of fracture or dislocation.

## 2018-02-20 IMAGING — DX DG HIP (WITH OR WITHOUT PELVIS) 2-3V*L*
3 series · 3 of 3 positions shown · non-contrast
Comparison: None.

CLINICAL DATA: Status post fall when chair broke, with left hip
pain. Initial encounter.

EXAM:
DG HIP (WITH OR WITHOUT PELVIS) 2-3V LEFT

[pelvis ap]
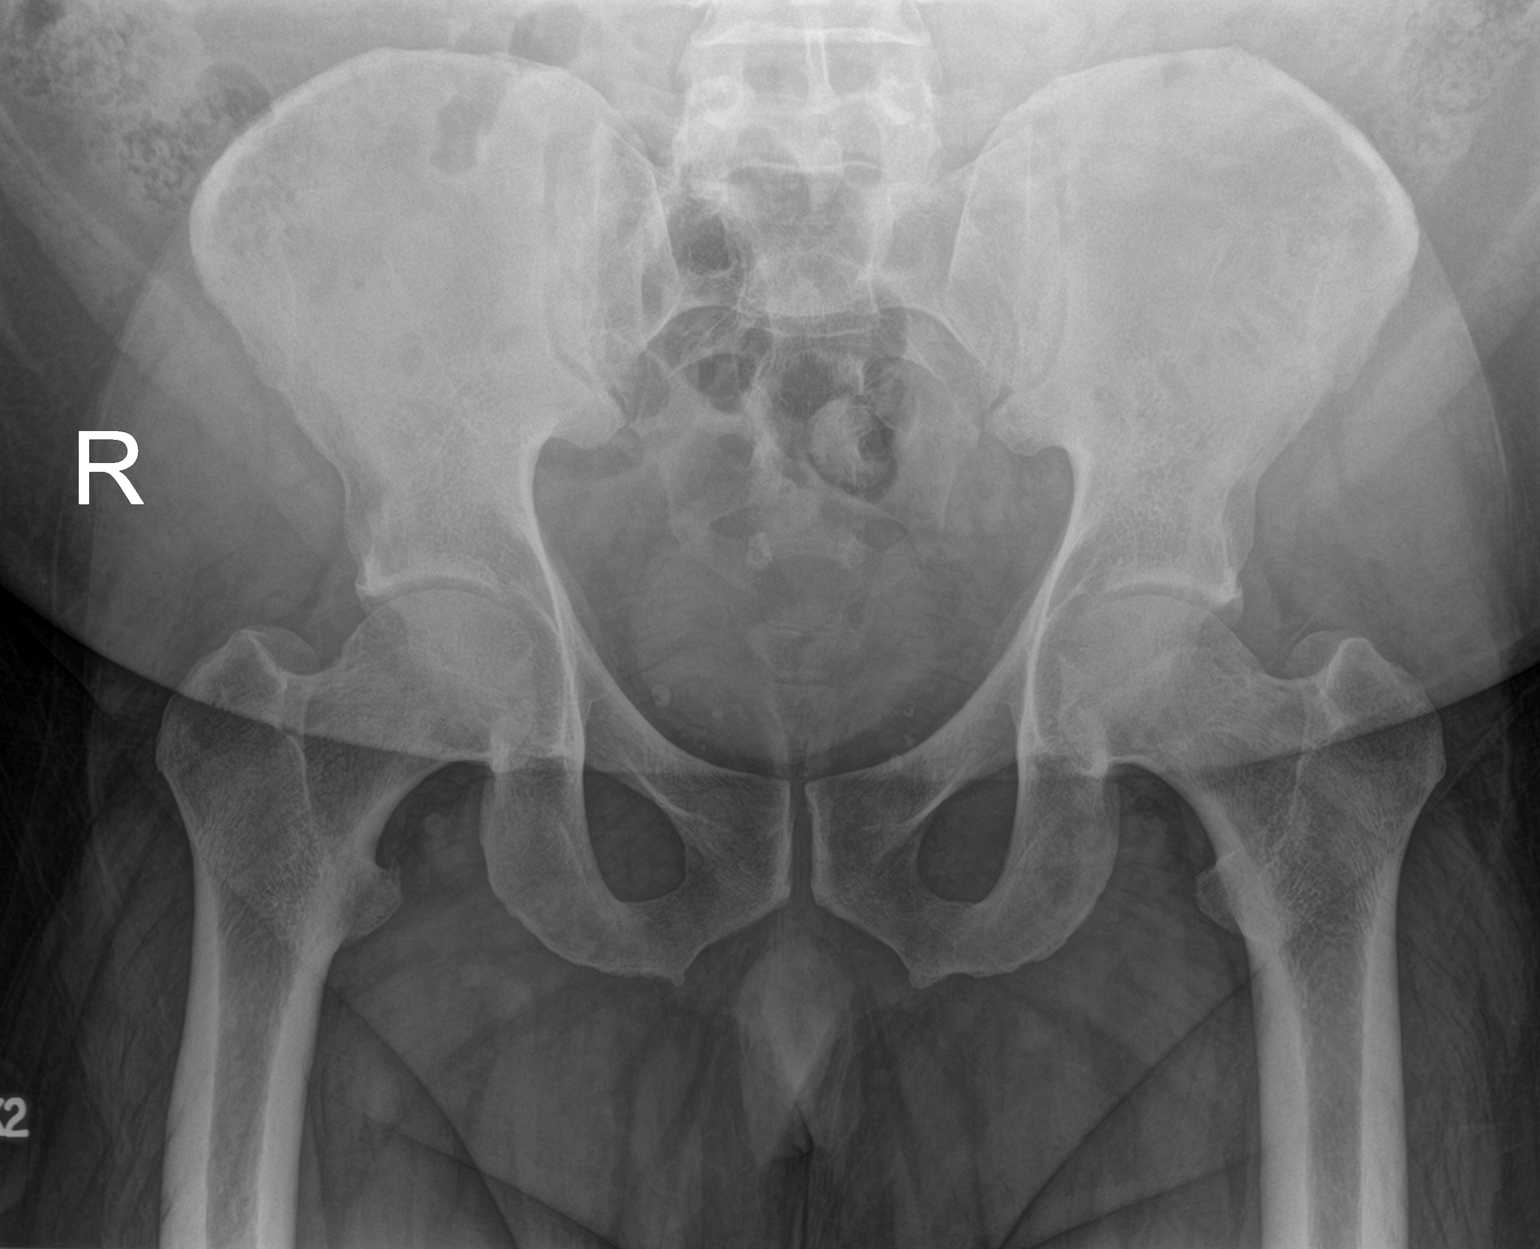

[hip ap]
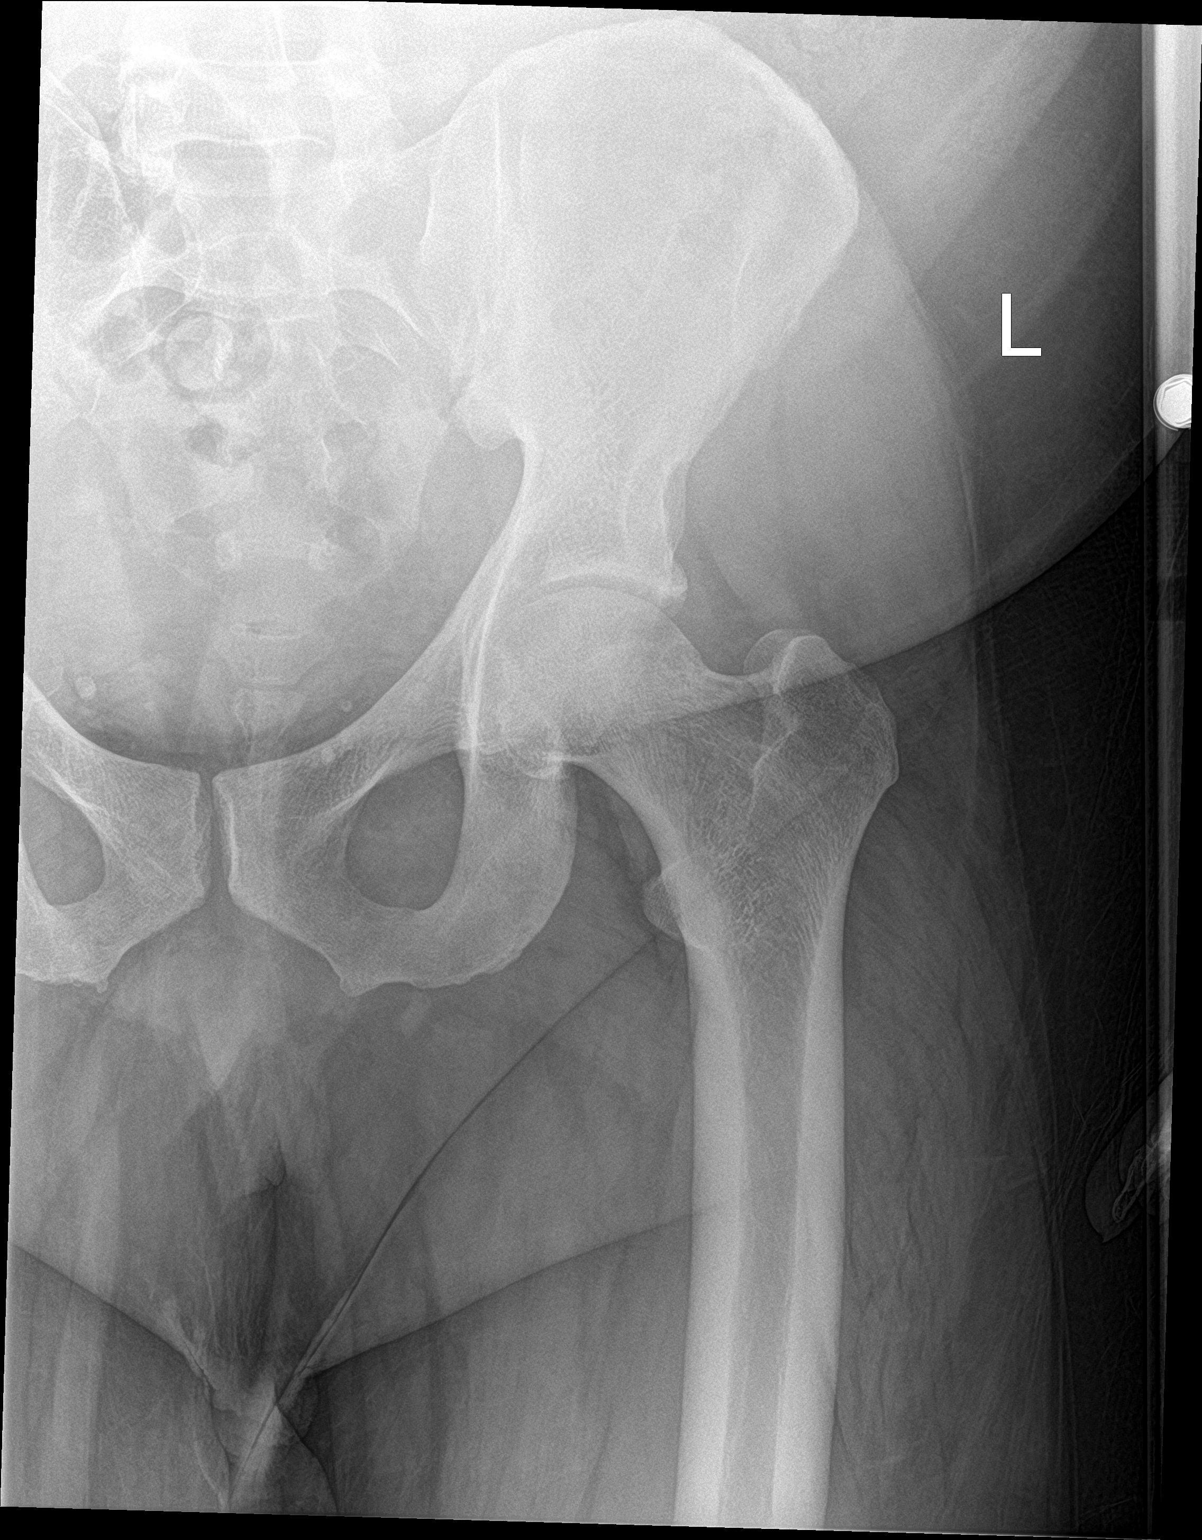

[hip lat]
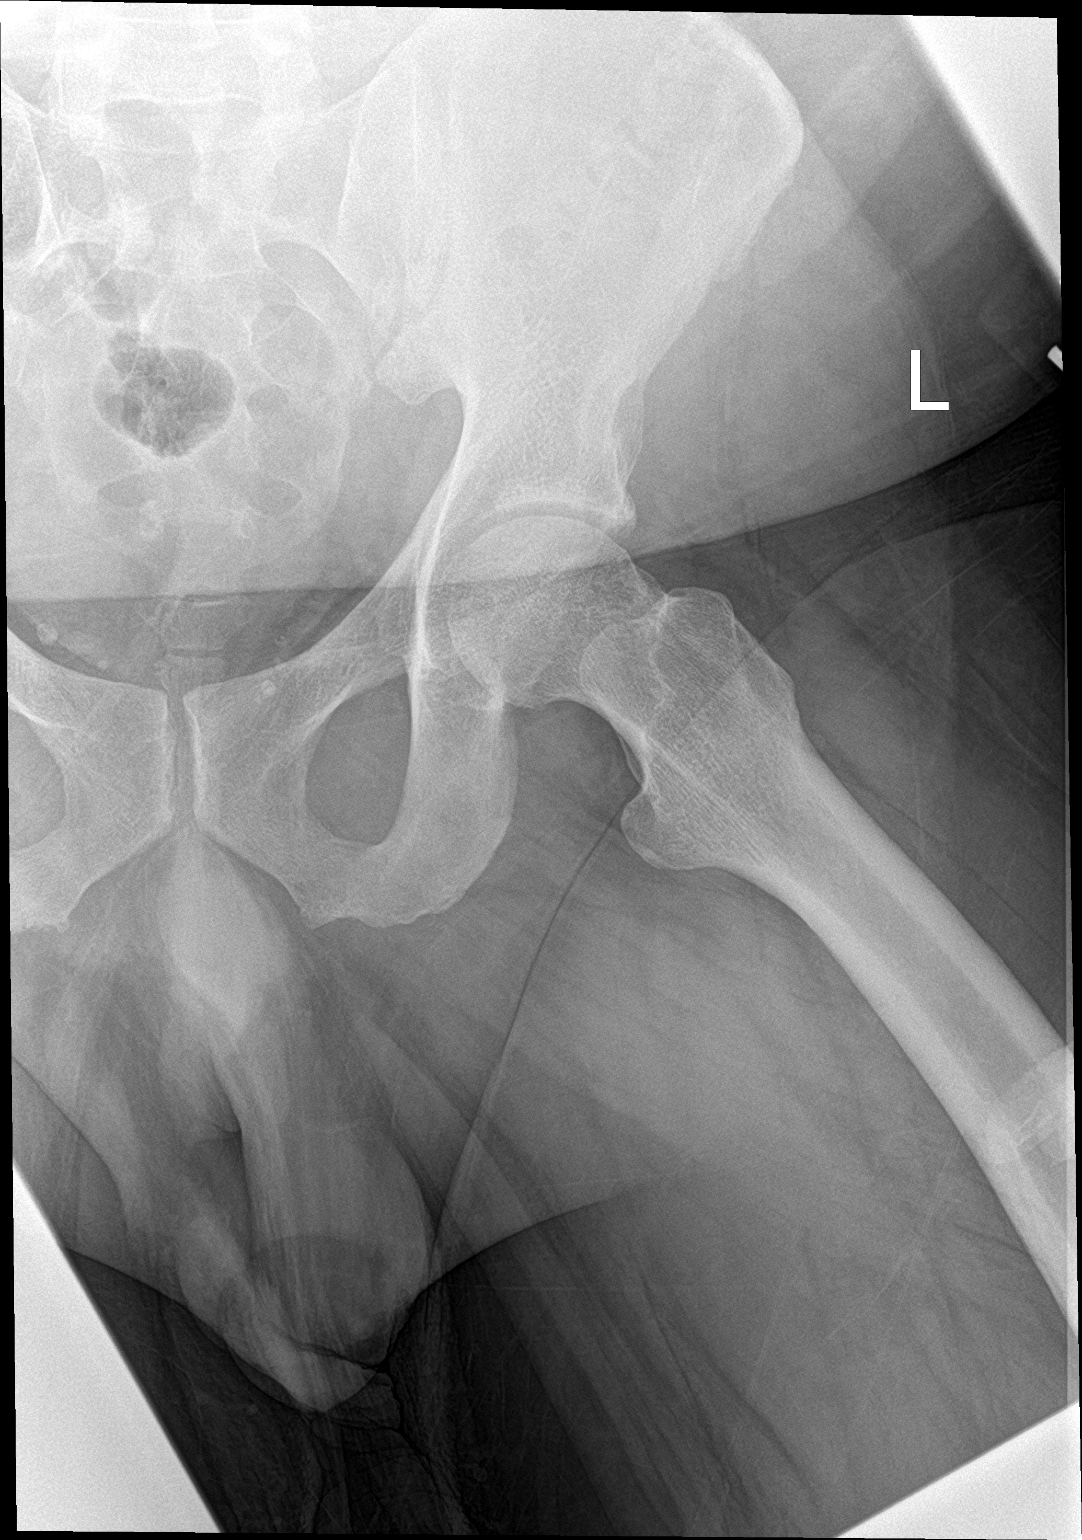

[3 of 3 positions shown; findings below may reference images not displayed]

FINDINGS: There is no evidence of fracture or dislocation. Both femoral heads
are seated normally within their respective acetabula. The proximal
left femur appears intact. No significant degenerative change is
appreciated. The sacroiliac joints are unremarkable in appearance.

The visualized bowel gas pattern is grossly unremarkable in
appearance. Scattered phleboliths are noted within the pelvis.
IMPRESSION: No evidence of fracture or dislocation.
